# Patient Record
Sex: Female | Born: 1995 | Race: White | Hispanic: No | Marital: Single | Smoking: Never smoker
Health system: Southern US, Community
[De-identification: ages and names within clinical notes are randomized; demographics above are authoritative.]

## PROBLEM LIST (undated history)

## (undated) DIAGNOSIS — G988 Other disorders of nervous system: Secondary | ICD-10-CM

## (undated) HISTORY — DX: Other disorders of nervous system: G98.8

---
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
## 2014-11-26 ENCOUNTER — Ambulatory Visit (INDEPENDENT_AMBULATORY_CARE_PROVIDER_SITE_OTHER): Payer: BLUE CROSS/BLUE SHIELD

## 2014-11-26 ENCOUNTER — Ambulatory Visit (INDEPENDENT_AMBULATORY_CARE_PROVIDER_SITE_OTHER): Payer: BLUE CROSS/BLUE SHIELD | Admitting: Emergency Medicine

## 2014-11-26 VITALS — BP 100/66 | HR 76 | Temp 98.6°F | Resp 16 | Ht 67.0 in | Wt 129.2 lb

## 2014-11-26 DIAGNOSIS — M79671 Pain in right foot: Secondary | ICD-10-CM

## 2014-11-26 NOTE — Progress Notes (Signed)
   Subjective:    Patient ID: Paige George, female    DOB: [birth year], 19 y.o.   MRN: [id number]  This chart was scribed for Paige EdinSteve A Marykatherine Sherwood, MD by Paige George, ED Scribe. This patient was seen in room 1 and the patient's care was started at 4:11 PM.   Chief Complaint  Patient presents with  . Foot Pain    Rt Foot injury from a fall on ice last night      HPI  HPI Comments: Paige George is a 19 y.o. female who presents to the Emergency Department for a right foot injury, as she fell on the ice last night and landed on her hyperextended right toes with all her body weight. She complains of associated pain on the top of her right foot. She is on BCP and multivitamin. She denies ankle and toe pain.    Review of Systems  Musculoskeletal: Positive for myalgias.       Objective:   Physical Exam  Nursing note and vitals reviewed.  CONSTITUTIONAL: Well developed/well nourished HEAD: Normocephalic/atraumatic EYES: EOMI/PERRL ENMT: Mucous membranes moist NECK: supple no meningeal signs SPINE/BACK:entire spine nontender CV: S1/S2 noted, no murmurs/rubs/gallops noted LUNGS: Lungs are clear to auscultation bilaterally, no apparent distress ABDOMEN: soft, nontender, no rebound or guarding, bowel sounds noted throughout abdomen GU:no cva tenderness NEURO: Pt is awake/alert/appropriate, moves all extremitiesx4.  No facial droop.   EXTREMITIES: pulses normal/equal, full ROM; tender over the distal fifth metatarsal. There is some bruising over the lateral distal foot, but NVI. SKIN: warm, color normal PSYCH: no abnormalities of mood noted, alert and oriented to situation  [facility] reading (PRIMARY) by  Dr. Cleta George no fracture seen      Assessment & Plan:  Patient appears to have a contusion foot sprain. We'll try a short cam boot to see if she can ambulate with this.I personally performed the services described in this documentation, which was scribed in my presence. The recorded information has  been reviewed and is accurate.

## 2014-11-30 ENCOUNTER — Telehealth: Payer: Self-pay

## 2014-11-30 NOTE — Telephone Encounter (Signed)
Spoke to pt- she does not have to sleep with the boot on- just to wear with activity. Pt may use an ace wrap to help keep ankle supported at night. Pt understands. She states her pain is not bad- manageable with tylenol.  She understands to RTC if no improvement within the next 1-2 weeks.

## 2014-11-30 NOTE — Telephone Encounter (Signed)
She was just in here on 11/26/14 with foot pain and was given a boot to wear. She wants to know if she should wear it at night and how long she should keep it on each day? She also wants to know if she should come back in for an x-ray because she still has pain. Please advise at [phone]

## 2015-08-26 IMAGING — CR DG FOOT COMPLETE 3+V*R*
3 series · 3 of 3 positions shown · non-contrast
Comparison: None.

CLINICAL DATA: Pt state she fell on the ice and injured her right
foot; she c/o lateral foot pain.(

EXAM:
RIGHT FOOT COMPLETE - 3+ VIEW

[AP]
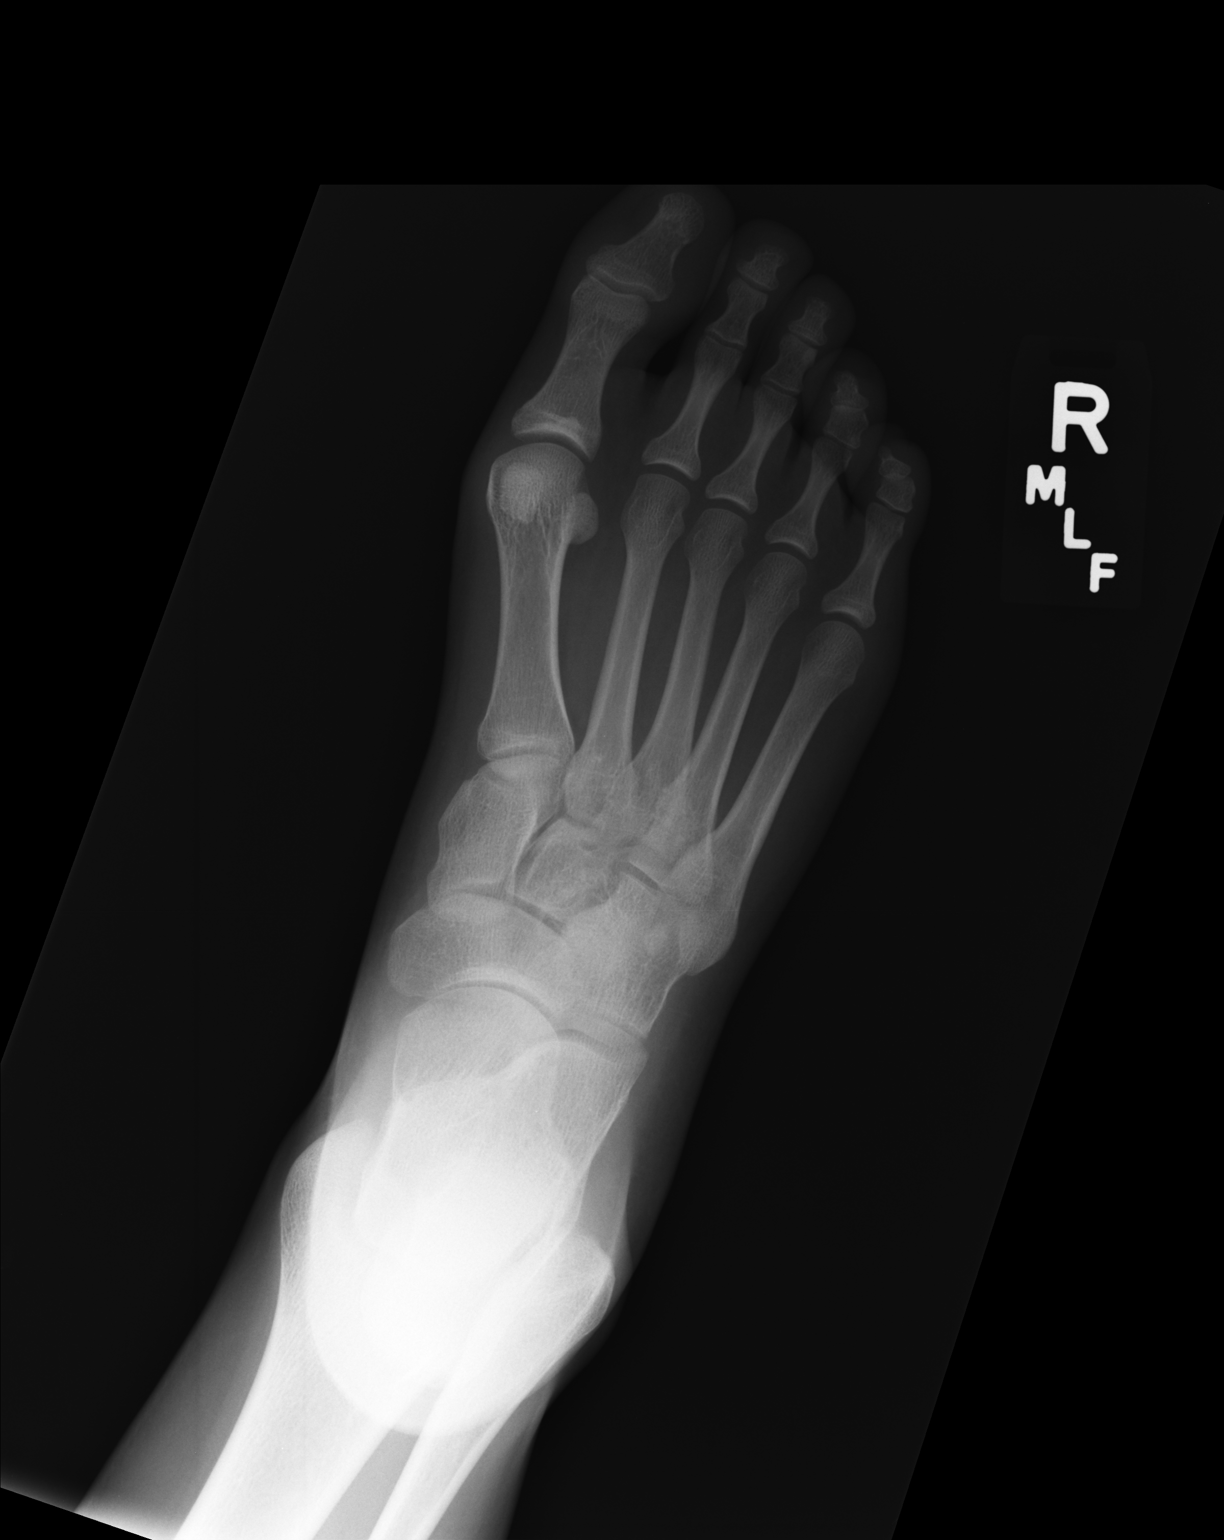

[ap obl int rot]
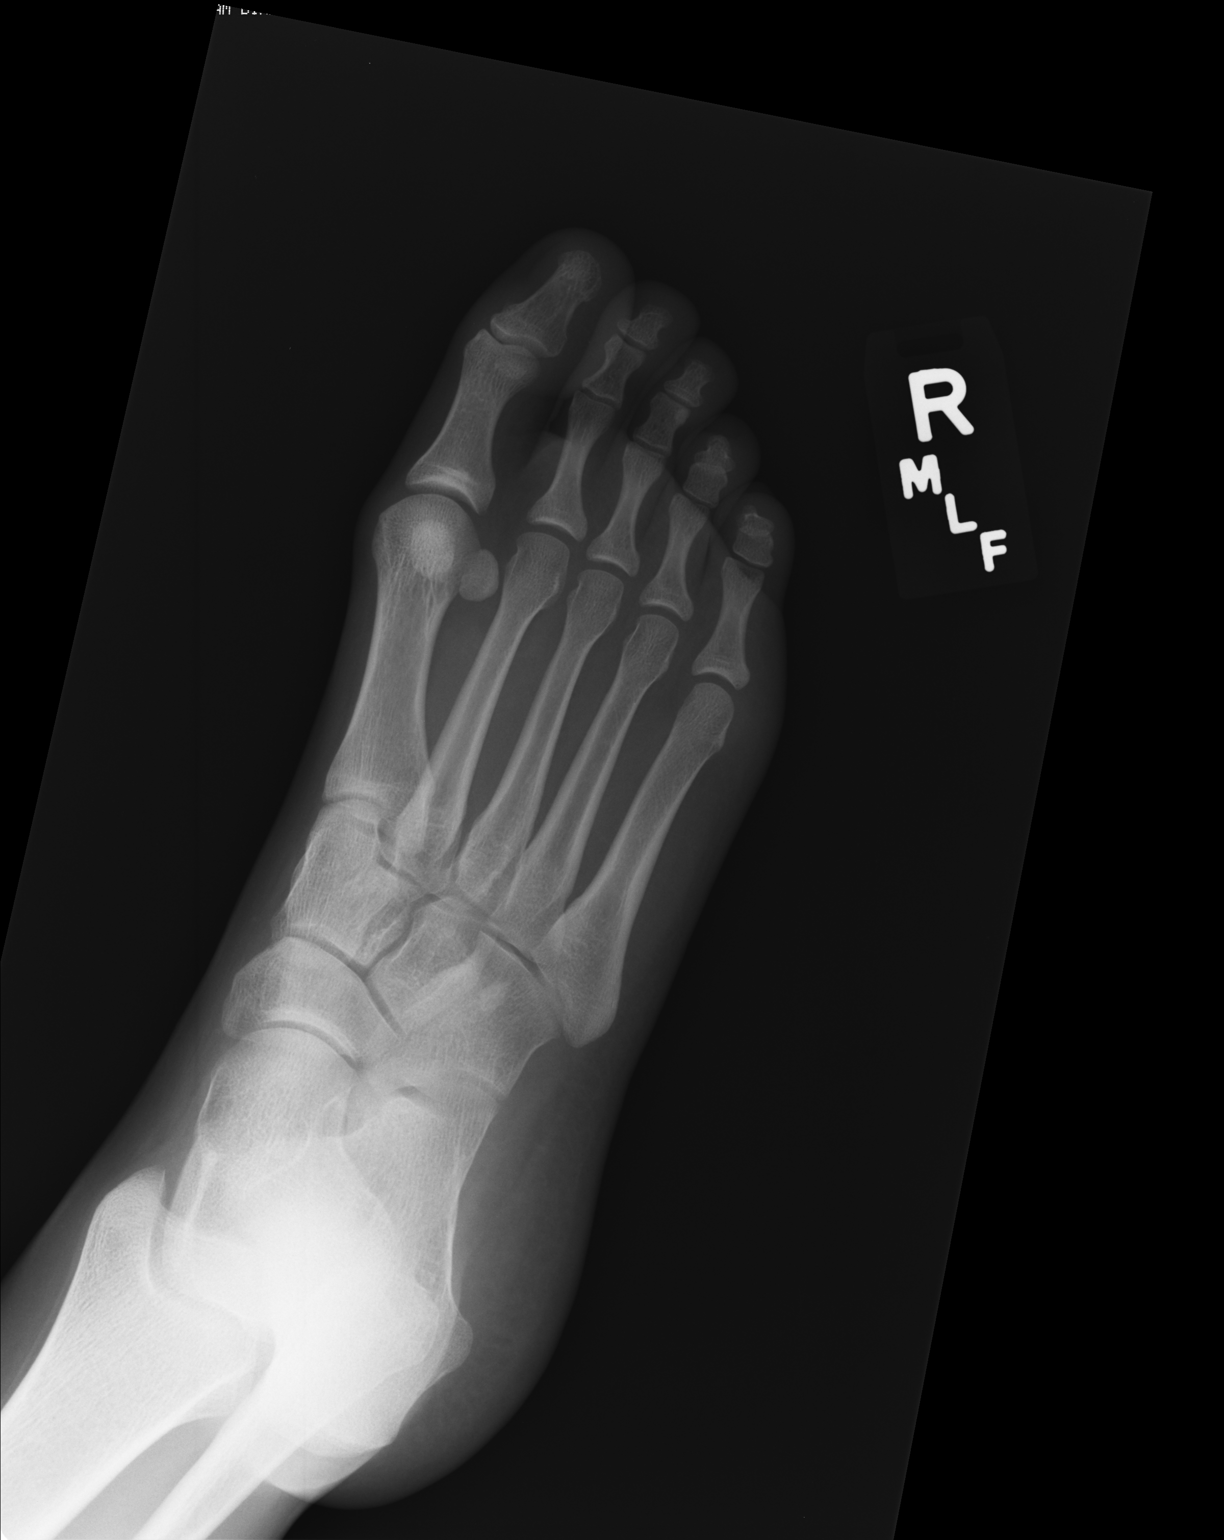

[lateral]
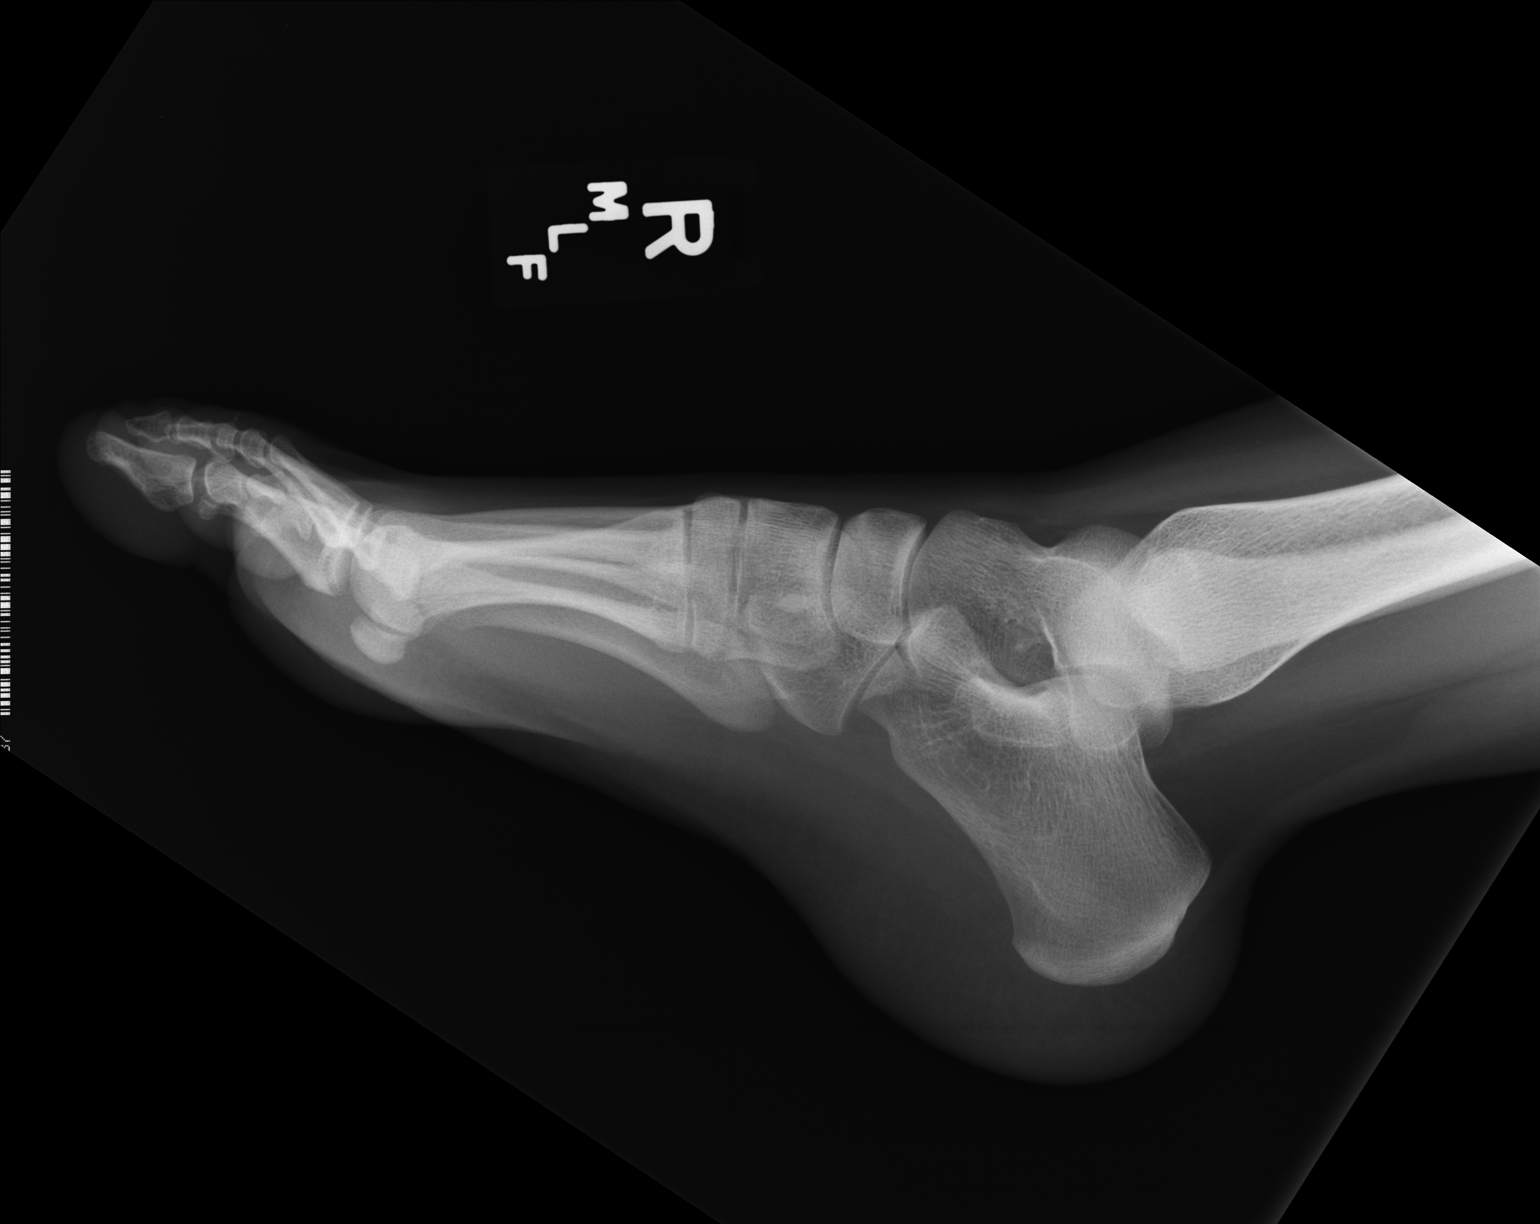

[3 of 3 positions shown; findings below may reference images not displayed]

FINDINGS: There is no evidence of fracture or dislocation. There is no
evidence of arthropathy or other focal bone abnormality. Soft
tissues are unremarkable.
IMPRESSION: Negative.

## 2018-07-03 ENCOUNTER — Ambulatory Visit: Admitting: Emergency Medicine

## 2018-07-03 ENCOUNTER — Emergency Department: Admit: 2018-07-03 | Disposition: A | Discharge status: Home / Self Care | Payer: 59

## 2018-07-03 DIAGNOSIS — R001 Bradycardia, unspecified: Secondary | ICD-10-CM

## 2018-07-03 DIAGNOSIS — R202 Paresthesia of skin: Secondary | ICD-10-CM

## 2018-07-03 DIAGNOSIS — M791 Myalgia, unspecified site: Secondary | ICD-10-CM

## 2018-07-03 DIAGNOSIS — R51 Headache: Secondary | ICD-10-CM

## 2018-07-03 DIAGNOSIS — M542 Cervicalgia: Principal | ICD-10-CM

## 2018-07-03 DIAGNOSIS — R110 Nausea: Secondary | ICD-10-CM

## 2018-07-03 LAB — HX DIABETES: HX GLUCOSE: 93 mg/dL (ref 70–139)

## 2018-07-03 LAB — HX HEM-ROUTINE
HX BASO #: 0 10*3/uL (ref 0.0–0.2)
HX BASO: 1 %
HX EOSIN #: 0.1 10*3/uL (ref 0.0–0.5)
HX EOSIN: 1 %
HX HCT: 38.2 % (ref 32.0–45.0)
HX HGB: 12.4 g/dL (ref 11.0–15.0)
HX IMMATURE GRANULOCYTE#: 0 10*3/uL (ref 0.0–0.1)
HX IMMATURE GRANULOCYTE: 0 %
HX LYMPH #: 1.4 10*3/uL (ref 1.0–4.0)
HX LYMPH: 26 %
HX MCH: 29.5 pg (ref 26.0–34.0)
HX MCHC: 32.5 g/dL (ref 32.0–36.0)
HX MCV: 90.7 fL (ref 80.0–98.0)
HX MONO #: 0.5 10*3/uL (ref 0.2–0.8)
HX MONO: 10 %
HX MPV: 9.7 fL (ref 9.1–11.7)
HX NEUT #: 3.4 10*3/uL (ref 1.5–7.5)
HX NRBC #: 0 10*3/uL
HX NUCLEATED RBC: 0 %
HX PLT: 267 10*3/uL (ref 150–400)
HX RBC BLOOD COUNT: 4.21 M/uL (ref 3.70–5.00)
HX RDW: 12.9 % (ref 11.5–14.5)
HX SEG NEUT: 62 %
HX WBC: 5.5 10*3/uL (ref 4.0–11.0)

## 2018-07-03 LAB — HX CHEM-PANELS
HX ANION GAP: 6 (ref 3–14)
HX BLOOD UREA NITROGEN: 17 mg/dL (ref 6–24)
HX CHLORIDE (CL): 105 meq/L (ref 98–110)
HX CO2: 27 meq/L (ref 20–30)
HX CREATININE (CR): 0.72 mg/dL (ref 0.57–1.30)
HX GFR, AFRICAN AMERICAN: 137 mL/min/{1.73_m2}
HX GFR, NON-AFRICAN AMERICAN: 118 mL/min/{1.73_m2}
HX GLUCOSE: 93 mg/dL (ref 70–139)
HX POTASSIUM (K): 4 meq/L (ref 3.6–5.1)
HX SODIUM (NA): 138 meq/L (ref 135–145)

## 2018-07-03 LAB — HX IMMUNOLOGY: HX BETA HCG QUANT: <5 m[IU]/mL (ref 0.0–5.0)

## 2018-07-03 NOTE — ED Provider Notes (Signed)
.  .  Name: Julie Andrade, Julie Andrade  MRN: [id number]  Age: 22 yrs  Sex: Female  DOB: Jul 07, 1996  Arrival Date: 07/03/2018  Arrival Time: 15:58  Account#: [id number]  Bed A15  PCP:  Chief Complaint:  - Sent in by urgent care.  .  Presentation:  08/31  16:01 Presenting complaint: Patient states: Sent from UC- Pt states   as51        she was seen by chiropractor neck adjustment and "didn't feel        right". Temporal headache, occipital pressure. +blurry vision,        dizziness, nausea, bilateral facial numbness.  16:01 Method Of Arrival: Walk In                                      as51  16:01 Acuity: Adult 2                                                 as51  .  Historical:  - Allergies:  16:06 No known drug Allergies;                                        as51  - PMHx:  20:23 POTS;                                                           pn6  .  - Social history: Smoking status: The patient is not a current    smoker. Patient uses No barriers to communication noted, The    patient lives at home, 1-2 EtOH per month. No tobacco.    Marijuana 4x weekly, no other drugs. Works at [employer]. Marland Kitchen  - Family history: No immediate family members are acutely ill.  - The history from nurses notes was reviewed: and I agree with    what is documented.  .  .  Screening:  16:07 SEPSIS SCREENING SIRS Criteria (> = 2) No. Safety screen:       as51        Patient feels safe. Suicide Screening: Patient denies thoughts        of harm. Fall Risk None identified. Exposure Risk/Travel        Screening: No. The patient reports that they have not travelled        outside out of the [place] in the past 30 days.  .  Vital Signs:  16:02 BP 125 / 90 Left Arm Sitting (auto/reg); Pulse 91 Monitor; Resp mb37        20 Spontaneous; Temp 36.9(O); Pulse Ox 100% on R/A;  20:40 BP 105 / 59; Pulse 54; Resp 18; Pulse Ox 100% on R/A;           lp7  23:05 BP 110 / 50; Pulse 55; Resp 18; Temp 36.6; Pulse Ox 99% on R/A; lp7  .  Glasgow Coma Score:  16:06 Eye Response:  spontaneous(4). Verbal Response: oriented(5).     as51  Motor Response: obeys commands(6). Total: 15.  21:04 Eye Response: spontaneous(4). Verbal Response: oriented(5).     lp7        Motor Response: obeys commands(6). Total: 15.  .  Name:Julie Andrade, Julie Andrade  [id number]  [id number]  Page 1 of 3  %%PAGE  .  Name: Julie Andrade, Julie Andrade  MRN: [id number]  Age: 45 yrs  Sex: Female  DOB: [birth year]  Arrival Date: 07/03/2018  Arrival Time: 15:58  Account#: [id number]  Bed A15  PCP:  Chief Complaint:  - Sent in by urgent care.  .  .  Triage Assessment:  16:06 General: Appears well nourished, well groomed, Behavior is      as51        anxious. Pain: Complains of pain in face. Neuro: Eye opening:        Spontaneously Level on consciousness: Sustained Attention        Verbal Response: Orientation: Speech: Clear. Cardiovascular:        Capillary refill < 3 seconds. Respiratory: Airway is patent        Respiratory effort is even, unlabored.  .  Assessment:  16:20 Reassessment: Inbound note states: expect to ED c/o dizziness   ac21        headache had chiropractic manipulation of her neck 24hrs ago        [facility].  17:45 Reassessment: Assumed care of this Patient following her        jg8        placement in A 15. The Triage Note was reviewed and the Patient        has no new c/o. The Patient was evaluated by the Provider. An        IV was established and labs sent. The Patient reports that she        frequently feels faint and at one point while placing the IV        said she was "going out". The Patient was easily redirected.  19:25 Reassessment: pt to CT at this time.                            lp7  21:04 General: Appears comfortable, Behavior is cooperative. Pain:    lp7        Denies pain. Neuro: Eye opening: Spontaneously Level on        consciousness: Sustained Attention Verbal Response:        Orientation: Speech: Clear. Cardiovascular: Capillary refill <        3 seconds. Respiratory: Airway is patent  Respiratory effort is        even, unlabored. Skin: Skin is pink, warm / dry.  22:06 Reassessment: pt to CT.                                         lp7  .  Observations:  15:58 Patient arrived in ED.                                          mj11  16:05 Triage Completed.  as51  16:17 Patient Visited By: Denyce Robert                              pn6  16:48 Patient Visited By: Lorraine Lax                          fdf  18:43 Patient Visited By: Lorraine Lax                          fdf  22:55 Patient Visited By: Denyce Robert                              pn6  .  Procedure:  18:34 Inserted peripheral IV: 18 gauge in right antecubital area and  jg8        blood (including any ordered blood cultures) drawn.  23:05 Discontinued bleeding controlled, pressure dressing applied, No lp7        redness/swelling at site.  Marland Kitchen  Name:Julie Andrade, Julie Andrade  MRN:[id number]  [id number]  Page 2 of 3  %%PAGE  .  Name: Julie Andrade, Julie Andrade  MRN: [id number]  Age: 35 yrs  Sex: Female  DOB: [birth year]  Arrival Date: 07/03/2018  Arrival Time: 15:58  Account#: [id number]  Bed A15  PCP:  Chief Complaint:  - Sent in by urgent care.  .  Dispensed Medications:  17:43 Drug: LR - Lactated Ringers Solution 1000 mL Route: IV;         jg8  23:05 Drug: Cyclobenzaprine 10 mg Route: PO;                          lp7  .  .  Interventions:  16:01 Driver's License Scanned into Chart                             sb36  .  Outcome:  22:55 Discharge ordered by MD.                                        pn6  23:05 Discharged to home ambulatory. Condition: good Condition:       lp7        stable. Discharge instructions given to patient, Instructed on        discharge instructions, follow up and referral plans.        Demonstrated understanding of instructions, Prescriptions given        X 1. Discharge Assessment: Patient awake, alert and oriented x        3. No cognitive and/or functional deficits noted. Patient         verbalized understanding of disposition instructions. Patient        awake and alert. Chart Status Nursing note complete and        electronically signed.  23:06 Patient left the ED.                                            lp7  .  Signatures:  Lorraine Lax  MD   fdf  Verl Dicker                         RN   [address] [place]                          Arnaldo Natal, Amy                           RN   Lupita Raider, Lauren                          BSN  lp7  Suprey, April                                as51  Azalee Course                   CCT  mb37  Joselyn Glassman                              sb36  Denyce Robert                          MD   pn6  .  .  .  .  .  .  .  .  .  .  .  .  Name:Julie Andrade, Julie Andrade  [id number]  [id number]  Page 3 of 3  .  %%END

## 2018-07-03 NOTE — ED Provider Notes (Signed)
.  .  Name: Julie Andrade, Julie Andrade  MRN: [id number]  Age: 22 yrs  Sex: Female  DOB: 02/24/1996  Arrival Date: 07/03/2018  Arrival Time: 15:58  Account#: [id number]  .  Working Diagnosis:  - DIZZINESS  PCP:  .  HPI:  08/31  18:34 This 22 yrs old White Female presents to ER via Walk In with    pn6        complaints of basilar headache after chiropractor.  18:34 22 yo with POTS who presents with head pressure, facial         pn6        numbness and nausea after visiting new chiropractor yesterday.        A few hours after the chiropractor, she had pressure in the        back of her head and lightheadedness. The pressure has        continued and she also has bitemporal sharp pain. She has not        vomited but endorses nausea since last night. Feels unsteady        with some walking, this AM she also felt like almost passing        out. She has had adequate PO intake. Denies CP or SOB. Had        normal head MRI in [place] in March..  .  Historical:  - Allergies: No known drug Allergies;  - PMHx: POTS;  - Social history: Smoking status: The patient is not a current    smoker. Patient uses No barriers to communication noted, The    patient lives at home, 1-2 EtOH per month. No tobacco.    Marijuana 4x weekly, no other drugs. Works at [employer]. Marland Kitchen  - Family history: No immediate family members are acutely ill.  - The history from nurses notes was reviewed: and I agree with    what is documented.  .  .  ROS:  20:23 Constitutional: Negative for chills, fever, poor PO intake.     pn6  20:23 ENT: Negative for sinus congestion, sore throat.  20:23 Neck Negative for pain with movement, pain at rest, stiffness.  20:23 Cardiovascular: Negative for chest pain, palpitations.  20:23 Respiratory: Negative for cough, shortness of breath.  20:23 Abdomen/GI: Positive for nausea, Negative for vomiting,        diarrhea, constipation, abdominal cramps.  20:23 Back: Negative for pain at rest.  20:23 GU: Negative for urinary symptoms.  20:23  MS/extremity: Positive for paresthesias, tingling.  20:23 Neuro: Positive for dizziness, gait disturbance,        Lightheadedness numbness, visual changes.  20:23 Allergy/Immunology: Negative for  20:23 Endocrine: Negative for polyuria, weight loss.  20:23 Hematologic/Lymphatic: Negative for abnormal bleeding.  .  Name:Julie Andrade, Julie Andrade  [id number]  [id number]  Page 1 of 6  %%PAGE  .  Name: Julie, Andrade  MRN: [id number]  Age: 45 yrs  Sex: Female  DOB: 02/09/1996  Arrival Date: 07/03/2018  Arrival Time: 15:58  Account#: [id number]  .  Working Diagnosis:  - DIZZINESS  PCP:  .  22:21 Eyes: Negative for visual disturbance, vision loss.             fdf  22:21 Skin: Negative for discoloration, ecchymosis, erythema.  .  Vital Signs:  16:02 BP 125 / 90 Left Arm Sitting (auto/reg); Pulse 91 Monitor; Resp mb37        20 Spontaneous; Temp 36.9(O); Pulse Ox 100% on R/A;  20:40 BP 105 /  59; Pulse 54; Resp 18; Pulse Ox 100% on R/A;           lp7  23:05 BP 110 / 50; Pulse 55; Resp 18; Temp 36.6; Pulse Ox 99% on R/A; lp7  .  Glasgow Coma Score:  16:06 Eye Response: spontaneous(4). Verbal Response: oriented(5).     as51        Motor Response: obeys commands(6). Total: 15.  21:04 Eye Response: spontaneous(4). Verbal Response: oriented(5).     lp7        Motor Response: obeys commands(6). Total: 15.  .  Exam:  20:23 Constitutional:  Gen: NAD ENT: no oropharyngeal erythema, no    pn6        difficulty swallowing. Lymph: no cervical or supraclavicular        LAD. CV: RRR, no murmurs. Pulm: CTA throughout lung fields, no        wheezing or crackles. Abd: soft, NDNT, +BS. Extrem: no LE        edema, 2+ DP pulses. GU: no suprapubic tenderness, no CVA        tenderness. Neuro: A/O x3, CN III-XII grossly intact except for        slight decr sensation on R maxilla, nml cerebellar function,        reports some dizziness during exam when moving head side to        side, gait slightly unsteady. MSK: 5/5 strength in all        extremities.  nml ROM.  Marland Kitchen  MDM:  20:33 ED course: 22 yo with POTS who reports basilar headache,        pn6        lightheadedness and R facial numbness after chiropractor        manipulation yesterday concerning for possible vertebro-basilar        dissection. Could also be benign etiology - musculoskeletal        pain, exacerbation of hypotension from POTS. CTA head/neck        ordered. Given zofran and IVF for nausea. . Data reviewed: lab        test result(s), vital signs.  21:46 ED course: CT head prelim read benign. Neck not scanned due to  pn6        order error - discussed error with patient, including need for        second dose of contrast, and patient was amenable to scan. .  22:21 Data interpreted: Pulse oximetry: Interpretation: normal.       fdf  .  08/31  16:51 Order name: BUN (Blood Urea Nitrogen); Complete Time: 18:43     pn6  .  Name:Julie Andrade, Julie Andrade  [id number]  [id number]  Page 2 of 6  %%PAGE  .  Name: Julie Andrade, Julie Andrade  MRN: [id number]  Age: 29 yrs  Sex: Female  DOB: February 17, 1996  Arrival Date: 07/03/2018  Arrival Time: 15:58  Account#: [id number]  .  Working Diagnosis:  - DIZZINESS  PCP:  .  08/31  16:51 Order name: CBC/Diff (With Plt); Complete Time: 18:43           pn6  08/31  16:51 Order name: CR (Creatinine); Complete Time: 18:43               pn6  08/31  16:51 Order name: GLU (Glucose); Complete Time: 18:43                 pn6  08/31  16:51 Order name: LYTES (Na, K, Cl,  Co2); Complete Time: 18:43        pn6  08/31  16:54 Order name: Beta Hcg Quant; Complete Time: 18:43                pn6  08/31  16:51 Order name: Ctangiogram Head W+wo Contrast (Cta)                pn6  08/31  18:22 Order name: GFR, AA; Complete Time: 18:43                       dispa  t  08/31  18:22 Order name: GFR, NAA; Complete Time: 18:43                      dispa  t  08/31  21:27 Order name: Ctangiogram Neck W+wo Contrast (Cta)                pn6  .  Dispensed Medications:  17:43 Drug: LR - Lactated Ringers Solution 1000 mL Route:  IV;         jg8  23:05 Drug: Cyclobenzaprine 10 mg Route: PO;                          lp7  .  .  Radiology Orders:  Order Name: Ctangiogram Head W+wo Contrast (Cta); Last Status:    Returned; Time: 07/03/18 16:51; By: pn6; For: pn6; Order    Method: Electronic; Notes: Bed Name: A15  Order Name: Ctangiogram Neck W+wo Contrast (Cta); Last Status:    Returned; Time: 07/03/18 21:27; By: pn6; For: pn6; Order    Method: Electronic; Notes: Bed Name: A15  Attending Notes:  22:21 Attestation: Assessment and care plan reviewed with             fdf        resident/midlevel provider. See their note for details.        Resident's history reviewed, patient interviewed and examined.        Attending HPI: HPI: Patient presents with complaints of acute        onset of headache, neck discomfort, and dizziness since        yesterday, starting soon after she had a manipulation of her        head and neck by a new chiropractor. She felt the symptoms very        abruptly. She also has some discomfort in the dorsal aspect of        her neck, somewhat worse with movement. By dizziness she        describes a sensation of feeling off balance but not vertigo.  .  Name:Julie Andrade, Julie Andrade  [id number]  [id number]  Page 3 of 6  %%PAGE  .  Name: Julie Andrade, Julie Andrade  MRN: [id number]  Age: 43 yrs  Sex: Female  DOB: 1996-04-27  Arrival Date: 07/03/2018  Arrival Time: 15:58  Account#: [id number]  .  Working Diagnosis:  - DIZZINESS  PCP:  .        No visual changes. Very vague tingly sensation in her face and        her hands as well. She has a history of POTS, so she wasn't        sure if any the symptoms were related to back, so she tried        taking extra salt and fluids, without relief. She apparently  went to an urgent care first and was referred in here. She        tried taking analgesics as well without any relief. No loss of        consciousness, gait disturbance. Otherwise healthy. No other        acute complaints. Symptoms have stayed  pretty much the same        symptoms when they started last night. Attending Exam: My        personal exam reveals The patient is awake and alert and        appears in no acute distress. Her vital signs are notable for a        low normal blood pressure and bradycardia. NC/AT without        apparent deformity. Pupils equal round reactive to light/EOMI        visual acuity is grossly intact. Neck is supple with full range        of motion, no tenderness. No bruits. Nonfocal neuro exam:        Cranial nerves grossly symmetric, sensation to light touch        grossly intact throughout, motor strength full, gait steady,        speech clear. Fully oriented. Respiratory: Lungs clr, no resp        distress Cardiovascular: RRR w/o MRG, pulses strong, color nl        Abdomen/GI: soft, NT, Nl BS, no HSM or mass Back: FROM, no bony        tenderness, no CVAT Skin: No obvious injury or diaphoresis,        turgor good MS/ Extremity: FROM with good strength, no obvious        deformity Psych: Pleasant, cooperative, nl insight. I have        reviewed the Nurses Notes, and I agree. ED Course: Although        unlikely, as we are concerned about the possibility of vascular        injury or intracerebral hemorrhage and CT and CTA have been        ordered, to be read by radiology.  22:46 ED Course: CT angiogram of the head and neck, including CT of   fdf        the head, read by radiology, shows no acute findings. Patient        is sent home urged to use analgesics, and given a short course        of a muscle relaxant. She understands to return or seek medical        attention if worse. My Working Impression: Acute neck pain,        acute headache. Attending chart complete and electronically        signed: Sharrell Ku. Zachery Conch MD, MS, Armando Gang [phone].  Marland Kitchen  Disposition Summary:  07/03/18 22:55  Discharge Ordered        Location: Home -                                                pn6        Problem: new  pn6        Symptoms: have improved                                         pn6        Condition: Stable                                               pn6  .  Name:Julie Andrade, Julie Andrade  [id number]  [id number]  Page 4 of 6  %%PAGE  .  Name: Julie Andrade, Julie Andrade  MRN: [id number]  Age: 70 yrs  Sex: Female  DOB: 27-Oct-1996  Arrival Date: 07/03/2018  Arrival Time: 15:58  Account#: [id number]  .  Working Diagnosis:  - DIZZINESS  PCP:  .        Diagnosis          - DIZZINESS                                                   pn6        Followup:                                                       pn6          - With:          - When: As needed          - Reason: If symptoms return or worsen        Discharge Instructions:          - Discharge Summary Sheet                                     pn6        Forms:          - Medication Reconciliation Form                              pn6        Prescriptions:          - Cyclobenzaprine 10 mg Oral Tablet              - take 1 tablet by ORAL route every 8 hours; 5 tablet;    pn6        Refills: 0, Product Selection Permitted  Signatures:  Lorraine Lax                      MD   fdf  Dispatcher, [employer]                          dispa  Verl Dicker                         RN  jg8  Tereasa Coop                          BSN  lp7  Sycamore, April                                as51  Denyce Robert                          MD   pn6  .  Corrections: (The following items were deleted from the chart)  18:50 18:34 22 yo with POTS who presents with head pressure, facial   pn6        numbness and nausea after visiting new chiropractor yesterday.        Marland Kitchen pn6  19:08 16:51 CT Head+CT Scan ordered. dispat                           dispa  t  20:21 18:34 22 yo with POTS who presents with head pressure, facial   pn6        numbness and nausea after visiting new chiropractor yesterday.        A few hours after the chiropractor, she had pressure in the        back of her head  and lightheadedness. She has not vomited but        endorses nausea since last night. This AM she also felt like        almost passing out. She has had adequate PO intake. Denies CP        or SOB.Marland Kitchen pn6  20:23 18:34 22 yo with POTS who presents with head pressure, facial   pn6        numbness and nausea after visiting new chiropractor yesterday.        A few hours after the chiropractor, she had pressure in the        back of her head and lightheadedness. The pressure has        continued and she also has bitemporal sharp pain. She has not        vomited but endorses nausea since last night. Feels unsteady        with some walking, this AM she also felt like almost passing        out. She has had adequate PO intake. Denies CP or SOB.Marland Kitchen pn6  .  Name:Julie Andrade, Julie Andrade  [id number]  [id number]  Page 5 of 6  %%PAGE  .  Name: Julie Andrade, Julie Andrade  MRN: [id number]  Age: 49 yrs  Sex: Female  DOB: 1996-06-26  Arrival Date: 07/03/2018  Arrival Time: 15:58  Account#: [id number]  .  Working Diagnosis:  - DIZZINESS  PCP:  .  20:33 20:23 Constitutional: This is a well developed, well nourished  pn6        patient who is awake, alert, and in no acute distress. pn6  20:36 20:23 Constitutional: This is a well developed, well nourished  pn6        patient who is awake, alert, and in no acute distress. pn6  .  Document is preliminary until electronically or manually signed by the atte  nding physician  .  .  .  .  .  .  .  .  .  .  .  .  .  .  .  .  .  .  .  .  .  .  .  .  .  .  .  .  .  .  .  .  .  .  .  .  Marland Kitchen  Name:Julie Andrade, Julie Andrade  [id number]  [id number]  Page 6 of 6  .  %%END

## 2018-09-08 ENCOUNTER — Ambulatory Visit: Admitting: Cardiovascular Disease

## 2018-09-08 ENCOUNTER — Ambulatory Visit: Admitting: Internal Medicine

## 2018-09-08 ENCOUNTER — Ambulatory Visit: Admit: 2018-09-08 | Payer: 59

## 2018-09-08 DIAGNOSIS — R000 Tachycardia, unspecified: Secondary | ICD-10-CM

## 2018-09-08 DIAGNOSIS — R002 Palpitations: Secondary | ICD-10-CM

## 2018-09-08 DIAGNOSIS — R55 Syncope and collapse: Principal | ICD-10-CM

## 2018-09-08 NOTE — Progress Notes (Signed)
* * *        Julie Andrade**    --- ---    66 Y old Female, DOB: June 19, 1996, External MRN: [id number]    Account Number: [id number]    [address] [id number]    Home: [phone]    Insurance: [place] Payer ID: PAPER    PCP: Jonathon Bellows Referring: Jonathon Bellows External Visit ID: [id number]    [facility]        * * *    09/08/2018   **Appointment Provider:** Buzzy Han **CHN#:** [id number]    --- ---      **Supervising Provider:** Theophilus Kinds, MD    ---         **Current Medications**    ---    Taking     * Lexapro 10 MG Tablet 1.5 tablet Orally Once a day    ---    * Medication List reviewed and reconciled with the patient    ---      Past Medical History    ---       Anxiety.        ---    POTS.        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        ---       **Family History**    ---       Non-Contributory    ---    No FH of cardiac disease or cancers. No FH of POTS.    ---       **Social History**    ---    Alcohol    _Occasionally minimal_    Tobacco    history: _Never smoked_   Occasional recreational marijuana use, no  association with symptoms    Previously employed as a Arts development officer, now employed at [employer].    ---       **Allergies**    ---       N.K.D.A.    ---    Forrestine Him Verified]       **Review of Systems**    ---     _CardioVascular_ :    Neurologic +RSD arms bilaterally, controlled with exercise therapy.  General/Constitutional no fever, chills, night sweats. Gastrointestinal no  hematochezia, hematemses. Hematology +heavy menses. Peripheral Vascular no  claudication. Endocrine TSH normal per patient (checked at PCP in [place]  last year). Skin no rash. ENT no nosebleeds. Cardiovascular see HPI.  Respiratory no cough, wheezing, shortness of breath at rest, sore throat,  upper airway congestion, or hemoptysis. Musculoskeletal no myalgias.            **Reason for Appointment**    ---        1\. F/U ED VISIT POTS (Hx of POTS, recent [facility] ED visit for headache)    ---       **History of Present Illness**    ---     _Cardiology_ :    We had the pleasure of seeing Ms. Julie Andrade during her initial visit  to the [facility] on 09/08/2018 for evaluation of suspected  POTS. As you know, Julie Andrade is a 22 year old woman with history of anxiety and  suspected history of POTS.    On 07/03/2018, she presented to the ED for an episode of headache occurring  after chiropractor massage. This was thought to be muscle spasm, eventually  relieved with NSAIDs and antispasmodics.  At the time, she underwent CT head  and neck, showing no abnormalities. No labs were drawn.    Today, she is presenting with no active complaints. When asked, she reports  since the age of 10, she has had recurrent episodes of lightheadedness, and  palpitations whenever she stands up from a seated position. Because of her  symptoms, she had suffered multiple syncopal episodes (last syncopal episode  was on 08/2017), whereby she describes she immediately collapses after  standing up and remains unconscious for a few seconds before regaining  consciousness. Her symptoms occur consistently regardless of food and water  intake and at all times of the day. However, they are not related to exertion  per se. Otherwise, she denies any associated symptoms, including any signs or  symptoms of neurological deficits or autonomic dysfunction. 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 For this reason, she has been keeping herself  hydrated with salt tabs and multiple cups of tea per day (but not water), but  otherwise she has not been using any pharmacologic therapy. She reports that  these interventions have helped her symptoms significantly, but she continues  to be  symptomatic nonetheless and is interested in pursuing additional  therapies.    She is otherwise active, with no complaints of chest pain, dyspnea, PND,  orthopnea, or edema.       **Vital Signs**    ---    Pain scale 0, Ht-in 68, Wt-lbs 147, BMI 22.35, BP 112/72, HR 93, BSA 1.79, O2  97% ra, Ht-cm 172.72, Wt-kg 66.68    Using the patient's Apple Watch: HR=67 while sitting, then HR=112 immediately  after standing up.       **Examination**    ---     _Cardiac Testing_ :    EKG:  **09/08/2018:** Sinus arrhythmia, rate 68 bpm; normal intervals, normal  axis; no repolarization abnormalities.          **Physical Examination**    ---     _General Examination_ :    General Appearance well developed, well nourished, in no acute distress.    Head normocephalic.    Eyes sclera non-icteric.    Neck/Thyroid carotid pulse normal, no carotid bruit, no jugular venous  distension.    Skin no rashes; normal skin turgor.    Heart regular rate and rhythm, normal S1, normal S2, no murmurs, no gallops,  no friction rubs.    Lungs clear to auscultation bilaterally with no wheezing, no crackles, no  rhonchi; normal respiratory effort.    Abdomen soft, non tender, non distended, bowel sounds present, no  organomegaly, no prominent pulsation.    Musculoskeletal no muscle tenderness.    Extremities no edema, no clubbing, no cyanosis.    Psychological alert, oriented; mood congruent affect.          **Assessments**    ---    1\. Syncope - R55 (Primary)    ---    2\. Palpitation - R00.2    ---    3\. Postural orthostatic tachycardia syndrome - R00.0    ---      In summary, Julie Andrade is a 22 year old woman with history of anxiety and  suspected history of POTS. She has been experiencing lightheadedness,  palpitations, and recurrent episodes of syncope and collapse shortly after  standing up from a seated position. Her  symptoms have never been evaluated in  the past, but she suspected that she has POTS because she had worked on  research on POTS  in the past, and is familiar with the diagnosis. Today.  physical exam is unremarkable, but using the patient's Apple Watch, we were  able to document an increase in HR from 67 while sitting at rest, then up to  112 a few seconds after standing up, associated with reproducible symptoms of  her HPI.    Julie Andrade has never been formally evaluated for her symptoms. Today, we are  interested in obtaining a TTE to evaluate her cardiac structure and function  to rule out alternative etiologies, as well as a 14-day ZioPatch to evaluate  if her symptoms are caused by an arrhythmia.    To relieve her symptoms, Julie Andrade has been ingesting salt tablets, which she  reports have been helping her symptoms. In addition, she has been attempting  to maintain hydrated, but she has been drinking tea instead of water. However,  she feels that her symptoms, although improved, are not as well controlled as  she would like. Today, we instructed her that tea is in fact a diuretic, which  may make her lose volume and exacerbate her symptoms. She was agreeable to  drinking water instead. We also advised she uses compression stockings. We  suggested that if her symptoms do not resolve with the above measures, then we  would consider pharmacologic therapy. Interestingly, her symptoms are not  primarily due to sympathetic overtone, so we will hold off on B-blocker  (namely propranolol). Instead, we will consider either midodrine (which is to  be taken 3 times daily) or low-dose fludrocorstione (which would require close  follow-up of potassium levels). The choice of therapy will then be made based  on the patient's preference if she does not respond to conservative measures .    Because Julie Andrade has no PCP and recently moved from [place], we would  like her to establish care with a PCP to be followed up for her chronic non-  cardaic issues. We referred her to primary care at [facility] today.    It was our pleasure seeing Ms Andrade today. We  would like to see her again  shortly after this visit once the TTE and Ziopatch are completed, and  following the lifestyle changes we discussed today. She knows to call sooner  if new symptoms arise.    ---       **Treatment**    ---       **1\. Syncope**    _IMAGING: Loop Recorder / Ziopatch_    ---         **2\. Palpitation**    _IMAGING: Echo - Acquired, Full_         **3\. Postural orthostatic tachycardia syndrome**    Notes: Maintain adequate Na intake and hydration; recommended increasing her  intake of water (only drinks tea currently)    Patient feels she needs pharmocotherapy for better symptom control of postural  lightheadedness; consider florinef or midodrine if continued symptoms and  diagnostic evaluation consistent with POTS    .        **4\. Others**    Notes: Referral to [facility] to establish primary care.      **Procedure Codes**    ---       [id number] Complete Echo, acquired anomalies    ---       **Follow Up**    ---  after echo and ziopatch    **Appointment Provider:** Buzzy Han    Electronically signed by Salomon Mast , MD on 09/15/2018 at 08:10 PM EST    Sign off status: Completed        * * *        [facility]    [address]    [place], [address]    [place], [place] [address]    Tel: [phone]    Fax: [phone]              * * *          Patient: Julie Andrade, Julie Andrade DOB: Nov 12, 1995 Progress Note: Heloise Purpura Anisha Starliper  09/08/2018    ---    Note generated by eClinicalWorks EMR/PM Software ([email or web address])

## 2018-09-08 NOTE — Progress Notes (Signed)
.  Progress Notes  .  Patient: Julie Andrade  Provider: Buzzy Han  MD  .DOB: 02/09/1996 Age: 22 Y Sex: Female  Supervising Provider:: Theophilus Kinds, MD  Date: 09/08/2018  .  PCP: Jonathon Bellows    Date: 09/08/2018  .  --------------------------------------------------------------------------------  .  REASON FOR APPOINTMENT  .  1. F/U ED VISIT POTS (Hx of POTS, recent  ED visit for  headache)  .  HISTORY OF PRESENT ILLNESS  .  Cardiology:  We had the pleasure of seeing Julie.  Julie Andrade during her initial visit to the [facility] on 09/08/2018 for evaluation of suspected  POTS. As you know, Julie Andrade is a 22 year old woman with history  of anxiety and suspected history of POTS.On 07/03/2018, she  presented to the ED for an episode of headache occurring after  chiropractor massage. This was thought to be muscle spasm,  eventually relieved with NSAIDs and antispasmodics. At the time,  she underwent CT head and neck, showing no abnormalities. No labs  were drawn. Today, she is presenting with no active complaints.  When asked, she reports since the age of 4, she has had  recurrent episodes of lightheadedness, and palpitations whenever  she stands up from a seated position. Because of her symptoms,  she had suffered multiple syncopal episodes (last syncopal  episode was on 08/2017), whereby she describes she immediately  collapses after standing up and remains unconscious for a few  seconds before regaining consciousness. Her symptoms occur  consistently regardless of food and water intake and at all times  of the day. However, they are not related to exertion per se.  Otherwise, she denies any associated symptoms, including any  signs or symptoms of neurological deficits or autonomic  dysfunction. 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 For this reason, she has been keeping  herself hydrated with salt tabs and multiple cups of tea per day  (but not water), but otherwise she has not been using any  pharmacologic therapy. She reports that these interventions have  helped her symptoms significantly, but she continues to be  symptomatic nonetheless and is interested in pursuing additional  therapies.She is otherwise active, with no complaints of chest  pain, dyspnea, PND, orthopnea, or edema.  .  CURRENT MEDICATIONS  .  Taking Lexapro 10 MG Tablet 1.5 tablet Orally Once a day  Medication List reviewed and reconciled with the patient  .  PAST MEDICAL HISTORY  .  Anxiety  POTS  Residual neuropathic pain following arm fractures bilaterally  (RSD)  .  ALLERGIES  .  N.K.D.A.  Marland Kitchen  FAMILY HISTORY  .  Non-Contributory  No FH of cardiac disease or cancers. No FH of POTS.  .  SOCIAL HISTORY  .  Marland Kitchen  Alcohol  Occasionally minimal  .  .  Tobacco  history:Never smoked  .  Occasional recreational marijuana use, no association with  symptomsPreviously employed as a Arts development officer, now employed  at [employer].  Marland Kitchen  REVIEW OF SYSTEMS  .  CardioVascular:  .  Neurologic    +RSD arms bilaterally, controlled with exercise  therapy . General/Constitutional    no fever, chills, night  sweats . Gastrointestinal    no hematochezia, hematemses .  Hematology    +heavy menses . Peripheral Vascular  no  claudication . Endocrine    TSH normal per patient (checked at  PCP in [place] last year) . Skin    no rash . ENT    no  nosebleeds . Cardiovascular    see HPI . Respiratory    no cough,  wheezing, shortness of breath at rest, sore throat, upper airway  congestion, or hemoptysis . Musculoskeletal    no myalgias .  Marland Kitchen  VITAL SIGNS  .  Pain scale 0, Ht-in 68, Wt-lbs 147, BMI 22.35, BP 112/72, HR 93,  BSA 1.79, O2 97% ra, Ht-cm 172.72, Wt-kg 66.68Using the patient's  Apple Watch: HR=67 while sitting, then HR=112 immediately after  standing  up.  Marland Kitchen  EXAMINATION  .  Cardiac Testing:  EKG: 09/08/2018: Sinus arrhythmia, rate 68 bpm; normal intervals,  normal axis; no repolarization abnormalities.  .  PHYSICAL EXAMINATION  .  General Examination:  General Appearance  well developed, well nourished, in no acute  distress.  Head  normocephalic.  Eyes  sclera non-icteric.  Neck/Thyroid  carotid pulse normal, no carotid bruit, no jugular  venous distension.  Skin  no rashes; normal skin turgor.  Heart  regular rate and rhythm, normal S1, normal S2, no murmurs,  no gallops, no friction rubs.  Lungs  clear to auscultation bilaterally with no wheezing, no  crackles, no rhonchi; normal respiratory effort.  Abdomen  soft, non tender, non distended, bowel sounds present,  no organomegaly, no prominent pulsation.  Musculoskeletal  no muscle tenderness.  Extremities  no edema, no clubbing, no cyanosis.  Psychological  alert, oriented; mood congruent affect.  .  ASSESSMENTS  .  Syncope - R55 (Primary)  .  Palpitation - R00.2  .  Postural orthostatic tachycardia syndrome - R00.0  .  In summary, Julie Andrade is a 22 year old woman with history of  anxiety and suspected history of POTS. She has been experiencing  lightheadedness, palpitations, and recurrent episodes of syncope  and collapse shortly after standing up from a seated position.  Her symptoms have never been evaluated in the past, but she  suspected that she has POTS because she had worked on research on  POTS in the past, and is familiar with the diagnosis. Today.  physical exam is unremarkable, but using the patient's Apple  Watch, we were able to document an increase in HR from 67 while  sitting at rest, then up to 112 a few seconds after standing up,  associated with reproducible symptoms of her HPI. Julie Andrade has  never been formally evaluated for her symptoms. Today, we are  interested in obtaining a TTE to evaluate her cardiac structure  and function to rule out alternative etiologies, as well as a  14-day  ZioPatch to evaluate if her symptoms are caused by an  arrhythmia. To relieve her symptoms, Julie Andrade has been ingesting  salt tablets, which she reports have been helping her symptoms.  In addition, she has been attempting to maintain hydrated, but  she has been drinking tea instead of water. However, she feels  that her symptoms, although improved, are not as well controlled  as she would like. Today, we instructed her that tea is in fact a  diuretic, which may make her lose volume and exacerbate her  symptoms. She was agreeable to drinking water instead. We also  advised she uses compression stockings. We suggested that if her  symptoms do not resolve with the above measures, then we would  consider pharmacologic therapy. Interestingly, her  symptoms are  not primarily due to sympathetic overtone, so we will hold off on  B-blocker (namely propranolol). Instead, we will consider either  midodrine (which is to be taken 3 times daily) or low-dose  fludrocorstione (which would require close follow-up of potassium  levels). The choice of therapy will then be made based on the  patient's preference if she does not respond to conservative  measures .Because Julie Andrade has no PCP and recently moved from  [place], we would like her to establish care with a PCP to  be followed up for her chronic non-cardaic issues. We referred  her to primary care at [facility] today.It was our pleasure seeing Julie Andrade today. We would like to see her again shortly after this  visit once the TTE and Ziopatch are completed, and following the  lifestyle changes we discussed today. She knows to call sooner if  new symptoms arise.  .  TREATMENT  .  Syncope  Loop Recorder / [phone]  .  Marland Kitchen  Palpitation  Echo - Acquired, [id number]  .  Marland Kitchen  Postural orthostatic tachycardia syndrome  Notes: Maintain adequate Na intake and hydration; recommended  increasing her intake of water (only drinks tea currently)Patient  feels she needs pharmocotherapy  for better symptom control of  postural lightheadedness; consider florinef or midodrine if  continued symptoms and diagnostic evaluation consistent with  POTS.  .  .  Others  Notes: Referral to [facility] to establish primary care.  Marland Kitchen  PROCEDURE CODES  .  [id number] Complete Echo, acquired anomalies  .  FOLLOW UP  .  after echo and ziopatch  .  Marland Kitchen  Appointment Provider: Buzzy Han  .  Electronically signed by Salomon Mast , MD on  09/15/2018 at 08:10 PM EST  .  CONFIRMATORY SIGN OFF  I personally interviewed and examined the patient and both the fellow and I contributed to this electronic note. I agree with the history, exam, assessment and plan as detailed in this note and edited it as necessary.   .  Document electronically signed by Buzzy Han  MD  .

## 2018-09-15 ENCOUNTER — Ambulatory Visit (HOSPITAL_BASED_OUTPATIENT_CLINIC_OR_DEPARTMENT_OTHER): Admitting: Psychiatry

## 2018-09-16 ENCOUNTER — Ambulatory Visit: Admitting: Cardiovascular Disease

## 2018-09-16 ENCOUNTER — Ambulatory Visit: Admit: 2018-09-16 | Payer: 59

## 2018-09-16 DIAGNOSIS — R000 Tachycardia, unspecified: Principal | ICD-10-CM

## 2018-09-16 DIAGNOSIS — R55 Syncope and collapse: Secondary | ICD-10-CM

## 2018-10-18 ENCOUNTER — Ambulatory Visit

## 2018-11-29 ENCOUNTER — Ambulatory Visit: Admitting: Obstetrics & Gynecology

## 2018-11-29 NOTE — Progress Notes (Signed)
* * *      **  Julie Andrade**    ------    9 Y old Female, DOB: December 16, 1995    [address] [place], [place] [address]    Home: [phone]    Provider: Shelby Mattocks        * * *    Telephone Encounter    ---    Answered by  Tilden Dome Date: 11/29/2018       Time: 05:03 PM    Reason  No Show    ------            Message                     Missed appointment notice card sent to patient to reschedule.                Action Taken                     Pishkin,Lori  11/29/2018 5:03:25 PM >                     * * *                ---          * * *         PatientDESHANDA, Julie Andrade DOB: 11-May-1996 Provider: Shelby Mattocks  11/29/2018    ---    Note generated by eClinicalWorks EMR/PM Software ([email or web address])

## 2021-01-29 NOTE — Progress Notes (Signed)
* * *        Julie Andrade**    --- ---    25 Y old Female, DOB: June 19, 1996, External MRN: [id number]    Account Number: [id number]    [address] [id number]    Home: [phone]    Insurance: [place] Payer ID: PAPER    PCP: Jonathon Bellows Referring: Jonathon Bellows External Visit ID: [id number]    [facility]        * * *    09/08/2018   **Appointment Provider:** Buzzy Han **CHN#:** [id number]    --- ---      **Supervising Provider:** Theophilus Kinds, MD    ---         **Current Medications**    ---    Taking     * Lexapro 10 MG Tablet 1.5 tablet Orally Once a day    ---    * Medication List reviewed and reconciled with the patient    ---      Past Medical History    ---       Anxiety.        ---    POTS.        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        ---       **Family History**    ---       Non-Contributory    ---    No FH of cardiac disease or cancers. No FH of POTS.    ---       **Social History**    ---    Alcohol    _Occasionally minimal_    Tobacco    history: _Never smoked_   Occasional recreational marijuana use, no  association with symptoms    Previously employed as a Arts development officer, now employed at [employer].    ---       **Allergies**    ---       N.K.D.A.    ---    Forrestine Him Verified]       **Review of Systems**    ---     _CardioVascular_ :    Neurologic +RSD arms bilaterally, controlled with exercise therapy.  General/Constitutional no fever, chills, night sweats. Gastrointestinal no  hematochezia, hematemses. Hematology +heavy menses. Peripheral Vascular no  claudication. Endocrine TSH normal per patient (checked at PCP in [place]  last year). Skin no rash. ENT no nosebleeds. Cardiovascular see HPI.  Respiratory no cough, wheezing, shortness of breath at rest, sore throat,  upper airway congestion, or hemoptysis. Musculoskeletal no myalgias.            **Reason for Appointment**    ---        1\. F/U ED VISIT POTS (Hx of POTS, recent [facility] ED visit for headache)    ---       **History of Present Illness**    ---     _Cardiology_ :    We had the pleasure of seeing Julie Andrade during her initial visit  to the [facility] on 09/08/2018 for evaluation of suspected  POTS. As you know, Julie Andrade is a 25 year old woman with history of anxiety and  suspected history of POTS.    On 07/03/2018, she presented to the ED for an episode of headache occurring  after chiropractor massage. This was thought to be muscle spasm, eventually  relieved with NSAIDs and antispasmodics.  At the time, she underwent CT head  and neck, showing no abnormalities. No labs were drawn.    Today, she is presenting with no active complaints. When asked, she reports  since the age of 10, she has had recurrent episodes of lightheadedness, and  palpitations whenever she stands up from a seated position. Because of her  symptoms, she had suffered multiple syncopal episodes (last syncopal episode  was on 08/2017), whereby she describes she immediately collapses after  standing up and remains unconscious for a few seconds before regaining  consciousness. Her symptoms occur consistently regardless of food and water  intake and at all times of the day. However, they are not related to exertion  per se. Otherwise, she denies any associated symptoms, including any signs or  symptoms of neurological deficits or autonomic dysfunction. 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 For this reason, she has been keeping herself  hydrated with salt tabs and multiple cups of tea per day (but not water), but  otherwise she has not been using any pharmacologic therapy. She reports that  these interventions have helped her symptoms significantly, but she continues  to be  symptomatic nonetheless and is interested in pursuing additional  therapies.    She is otherwise active, with no complaints of chest pain, dyspnea, PND,  orthopnea, or edema.       **Vital Signs**    ---    Pain scale 0, Ht-in 68, Wt-lbs 147, BMI 22.35, BP 112/72, HR 93, BSA 1.79, O2  97% ra, Ht-cm 172.72, Wt-kg 66.68    Using the patient's Apple Watch: HR=67 while sitting, then HR=112 immediately  after standing up.       **Examination**    ---     _Cardiac Testing_ :    EKG:  **09/08/2018:** Sinus arrhythmia, rate 68 bpm; normal intervals, normal  axis; no repolarization abnormalities.          **Physical Examination**    ---     _General Examination_ :    General Appearance well developed, well nourished, in no acute distress.    Head normocephalic.    Eyes sclera non-icteric.    Neck/Thyroid carotid pulse normal, no carotid bruit, no jugular venous  distension.    Skin no rashes; normal skin turgor.    Heart regular rate and rhythm, normal S1, normal S2, no murmurs, no gallops,  no friction rubs.    Lungs clear to auscultation bilaterally with no wheezing, no crackles, no  rhonchi; normal respiratory effort.    Abdomen soft, non tender, non distended, bowel sounds present, no  organomegaly, no prominent pulsation.    Musculoskeletal no muscle tenderness.    Extremities no edema, no clubbing, no cyanosis.    Psychological alert, oriented; mood congruent affect.          **Assessments**    ---    1\. Syncope - R55 (Primary)    ---    2\. Palpitation - R00.2    ---    3\. Postural orthostatic tachycardia syndrome - R00.0    ---      In summary, Julie Andrade is a 25 year old woman with history of anxiety and  suspected history of POTS. She has been experiencing lightheadedness,  palpitations, and recurrent episodes of syncope and collapse shortly after  standing up from a seated position. Her  symptoms have never been evaluated in  the past, but she suspected that she has POTS because she had worked on  research on POTS  in the past, and is familiar with the diagnosis. Today.  physical exam is unremarkable, but using the patient's Apple Watch, we were  able to document an increase in HR from 67 while sitting at rest, then up to  112 a few seconds after standing up, associated with reproducible symptoms of  her HPI.    Ms. Kirn has never been formally evaluated for her symptoms. Today, we are  interested in obtaining a TTE to evaluate her cardiac structure and function  to rule out alternative etiologies, as well as a 14-day ZioPatch to evaluate  if her symptoms are caused by an arrhythmia.    To relieve her symptoms, Ms. Rottman has been ingesting salt tablets, which she  reports have been helping her symptoms. In addition, she has been attempting  to maintain hydrated, but she has been drinking tea instead of water. However,  she feels that her symptoms, although improved, are not as well controlled as  she would like. Today, we instructed her that tea is in fact a diuretic, which  may make her lose volume and exacerbate her symptoms. She was agreeable to  drinking water instead. We also advised she uses compression stockings. We  suggested that if her symptoms do not resolve with the above measures, then we  would consider pharmacologic therapy. Interestingly, her symptoms are not  primarily due to sympathetic overtone, so we will hold off on B-blocker  (namely propranolol). Instead, we will consider either midodrine (which is to  be taken 3 times daily) or low-dose fludrocorstione (which would require close  follow-up of potassium levels). The choice of therapy will then be made based  on the patient's preference if she does not respond to conservative measures .    Because Ms. Risdon has no PCP and recently moved from [place], we would  like her to establish care with a PCP to be followed up for her chronic non-  cardaic issues. We referred her to primary care at [facility] today.    It was our pleasure seeing Ms Ragle today. We  would like to see her again  shortly after this visit once the TTE and Ziopatch are completed, and  following the lifestyle changes we discussed today. She knows to call sooner  if new symptoms arise.    ---       **Treatment**    ---       **1\. Syncope**    _IMAGING: Loop Recorder / Ziopatch_    ---         **2\. Palpitation**    _IMAGING: Echo - Acquired, Full_         **3\. Postural orthostatic tachycardia syndrome**    Notes: Maintain adequate Na intake and hydration; recommended increasing her  intake of water (only drinks tea currently)    Patient feels she needs pharmocotherapy for better symptom control of postural  lightheadedness; consider florinef or midodrine if continued symptoms and  diagnostic evaluation consistent with POTS    .        **4\. Others**    Notes: Referral to [facility] to establish primary care.      **Procedure Codes**    ---       [id number] Complete Echo, acquired anomalies    ---       **Follow Up**    ---  after echo and ziopatch    **Appointment Provider:** Buzzy Han    Electronically signed by Salomon Mast , MD on 09/15/2018 at 08:10 PM EST    Sign off status: Completed        * * *        [facility]    [address]    [place], [address]    [place], [place] [address]    Tel: [phone]    Fax: [phone]              * * *          Patient: SURAH, PELLEY Andrade DOB: Nov 12, 1995 Progress Note: Heloise Purpura Anisha Starliper  09/08/2018    ---    Note generated by eClinicalWorks EMR/PM Software ([email or web address])
